# Patient Record
Sex: Male | Born: 1960 | Race: White | Hispanic: No | Marital: Married | Smoking: Current every day smoker
Health system: Southern US, Community
[De-identification: ages and names within clinical notes are randomized; demographics above are authoritative.]

## PROBLEM LIST (undated history)

## (undated) DIAGNOSIS — Z8489 Family history of other specified conditions: Secondary | ICD-10-CM

## (undated) DIAGNOSIS — R112 Nausea with vomiting, unspecified: Secondary | ICD-10-CM

## (undated) DIAGNOSIS — Z9889 Other specified postprocedural states: Secondary | ICD-10-CM

## (undated) DIAGNOSIS — K219 Gastro-esophageal reflux disease without esophagitis: Secondary | ICD-10-CM

## (undated) HISTORY — PX: KNEE SURGERY: SHX244

## (undated) HISTORY — PX: EYE SURGERY: SHX253

---

## 2018-08-18 ENCOUNTER — Emergency Department (HOSPITAL_COMMUNITY): Payer: Self-pay

## 2018-08-18 ENCOUNTER — Emergency Department (HOSPITAL_COMMUNITY): Payer: Self-pay | Admitting: Anesthesiology

## 2018-08-18 ENCOUNTER — Observation Stay (HOSPITAL_COMMUNITY)
Admission: EM | Admit: 2018-08-18 | Discharge: 2018-08-18 | Disposition: A | Discharge status: Home / Self Care | Payer: Self-pay | Attending: General Surgery | Admitting: General Surgery

## 2018-08-18 ENCOUNTER — Other Ambulatory Visit: Payer: Self-pay

## 2018-08-18 ENCOUNTER — Encounter (HOSPITAL_COMMUNITY)
Admission: EM | Disposition: A | Discharge status: Home / Self Care | Payer: Self-pay | Source: Home / Self Care | Attending: Emergency Medicine

## 2018-08-18 ENCOUNTER — Encounter (HOSPITAL_COMMUNITY): Payer: Self-pay

## 2018-08-18 DIAGNOSIS — R1011 Right upper quadrant pain: Secondary | ICD-10-CM

## 2018-08-18 DIAGNOSIS — I700 Atherosclerosis of aorta: Secondary | ICD-10-CM | POA: Insufficient documentation

## 2018-08-18 DIAGNOSIS — K8012 Calculus of gallbladder with acute and chronic cholecystitis without obstruction: Principal | ICD-10-CM | POA: Insufficient documentation

## 2018-08-18 DIAGNOSIS — K8000 Calculus of gallbladder with acute cholecystitis without obstruction: Secondary | ICD-10-CM

## 2018-08-18 DIAGNOSIS — I2510 Atherosclerotic heart disease of native coronary artery without angina pectoris: Secondary | ICD-10-CM | POA: Insufficient documentation

## 2018-08-18 DIAGNOSIS — F17200 Nicotine dependence, unspecified, uncomplicated: Secondary | ICD-10-CM | POA: Insufficient documentation

## 2018-08-18 DIAGNOSIS — R0781 Pleurodynia: Secondary | ICD-10-CM

## 2018-08-18 DIAGNOSIS — K219 Gastro-esophageal reflux disease without esophagitis: Secondary | ICD-10-CM | POA: Insufficient documentation

## 2018-08-18 DIAGNOSIS — J9811 Atelectasis: Secondary | ICD-10-CM | POA: Insufficient documentation

## 2018-08-18 DIAGNOSIS — K810 Acute cholecystitis: Secondary | ICD-10-CM

## 2018-08-18 HISTORY — DX: Gastro-esophageal reflux disease without esophagitis: K21.9

## 2018-08-18 HISTORY — DX: Family history of other specified conditions: Z84.89

## 2018-08-18 HISTORY — DX: Other specified postprocedural states: R11.2

## 2018-08-18 HISTORY — DX: Other specified postprocedural states: Z98.890

## 2018-08-18 HISTORY — PX: CHOLECYSTECTOMY: SHX55

## 2018-08-18 LAB — BASIC METABOLIC PANEL
Anion gap: 10 (ref 5–15)
BUN: 9 mg/dL (ref 6–20)
CO2: 21 mmol/L — ABNORMAL LOW (ref 22–32)
Calcium: 8.8 mg/dL — ABNORMAL LOW (ref 8.9–10.3)
Chloride: 104 mmol/L (ref 98–111)
Creatinine, Ser: 0.88 mg/dL (ref 0.61–1.24)
GFR calc Af Amer: >60 mL/min (ref >60)
GFR calc non Af Amer: >60 mL/min (ref >60)
Glucose, Bld: 161 mg/dL — ABNORMAL HIGH (ref 70–99)
Potassium: 3.6 mmol/L (ref 3.5–5.1)
Sodium: 135 mmol/L (ref 135–145)

## 2018-08-18 LAB — I-STAT TROPONIN, ED: Troponin i, poc: 0 ng/mL (ref 0.00–0.08)

## 2018-08-18 LAB — CBC
HCT: 42.1 % (ref 39.0–52.0)
Hemoglobin: 14.1 g/dL (ref 13.0–17.0)
MCH: 29.9 pg (ref 26.0–34.0)
MCHC: 33.5 g/dL (ref 30.0–36.0)
MCV: 89.4 fL (ref 80.0–100.0)
PLATELETS: 228 10*3/uL (ref 150–400)
RBC: 4.71 MIL/uL (ref 4.22–5.81)
RDW: 13.1 % (ref 11.5–15.5)
WBC: 12.3 10*3/uL — ABNORMAL HIGH (ref 4.0–10.5)
nRBC: 0 % (ref 0.0–0.2)

## 2018-08-18 LAB — HEPATIC FUNCTION PANEL
ALBUMIN: 3.9 g/dL (ref 3.5–5.0)
ALT: 60 U/L — ABNORMAL HIGH (ref 0–44)
AST: 29 U/L (ref 15–41)
Alkaline Phosphatase: 64 U/L (ref 38–126)
Bilirubin, Direct: <0.1 mg/dL (ref 0.0–0.2)
Total Bilirubin: 0.5 mg/dL (ref 0.3–1.2)
Total Protein: 7.2 g/dL (ref 6.5–8.1)

## 2018-08-18 LAB — LIPASE, BLOOD: Lipase: 32 U/L (ref 11–51)

## 2018-08-18 SURGERY — LAPAROSCOPIC CHOLECYSTECTOMY
Anesthesia: General

## 2018-08-18 MED ORDER — MEPERIDINE HCL 50 MG/ML IJ SOLN: 6.2500 mg | INTRAMUSCULAR | Status: DC | PRN

## 2018-08-18 MED ORDER — DOCUSATE SODIUM 100 MG PO CAPS: 100.0000 mg | ORAL_CAPSULE | Freq: Two times a day (BID) | ORAL | Status: DC

## 2018-08-18 MED ORDER — SUGAMMADEX SODIUM 500 MG/5ML IV SOLN
INTRAVENOUS | Status: DC | PRN
  Administered 2018-08-18: 399.2 mg via INTRAVENOUS

## 2018-08-18 MED ORDER — DEXAMETHASONE SODIUM PHOSPHATE 4 MG/ML IJ SOLN
INTRAMUSCULAR | Status: DC | PRN
  Administered 2018-08-18: 8 mg via INTRAVENOUS

## 2018-08-18 MED ORDER — ONDANSETRON 4 MG PO TBDP: 4.0000 mg | ORAL_TABLET | Freq: Four times a day (QID) | ORAL | 0 refills | Status: DC | PRN

## 2018-08-18 MED ORDER — NICOTINE 21 MG/24HR TD PT24: 21.0000 mg | MEDICATED_PATCH | Freq: Every day | TRANSDERMAL | Status: DC

## 2018-08-18 MED ORDER — SIMETHICONE 80 MG PO CHEW: 40.0000 mg | CHEWABLE_TABLET | Freq: Four times a day (QID) | ORAL | Status: DC | PRN

## 2018-08-18 MED ORDER — FENTANYL CITRATE (PF) 250 MCG/5ML IJ SOLN
INTRAMUSCULAR | Status: AC
  Filled 2018-08-18: qty 5

## 2018-08-18 MED ORDER — IOPAMIDOL (ISOVUE-300) INJECTION 61%
100.0000 mL | Freq: Once | INTRAVENOUS | Status: AC | PRN
  Administered 2018-08-18: 100 mL via INTRAVENOUS

## 2018-08-18 MED ORDER — SUGAMMADEX SODIUM 500 MG/5ML IV SOLN
INTRAVENOUS | Status: AC
  Filled 2018-08-18: qty 5

## 2018-08-18 MED ORDER — PROPOFOL 10 MG/ML IV BOLUS
INTRAVENOUS | Status: DC | PRN
  Administered 2018-08-18: 200 mg via INTRAVENOUS

## 2018-08-18 MED ORDER — PROPOFOL 10 MG/ML IV BOLUS
INTRAVENOUS | Status: AC
  Filled 2018-08-18: qty 20

## 2018-08-18 MED ORDER — FENTANYL CITRATE (PF) 100 MCG/2ML IJ SOLN
INTRAMUSCULAR | Status: AC
  Filled 2018-08-18: qty 2

## 2018-08-18 MED ORDER — FENTANYL CITRATE (PF) 100 MCG/2ML IJ SOLN
100.0000 ug | Freq: Once | INTRAMUSCULAR | Status: AC
  Administered 2018-08-18: 100 ug via INTRAVENOUS
  Filled 2018-08-18: qty 2

## 2018-08-18 MED ORDER — ONDANSETRON HCL 4 MG/2ML IJ SOLN
INTRAMUSCULAR | Status: DC | PRN
  Administered 2018-08-18: 4 mg via INTRAVENOUS

## 2018-08-18 MED ORDER — OXYCODONE HCL 5 MG PO TABS: 5.0000 mg | ORAL_TABLET | ORAL | Status: DC | PRN

## 2018-08-18 MED ORDER — ONDANSETRON HCL 4 MG/2ML IJ SOLN: 4.0000 mg | Freq: Once | INTRAMUSCULAR | Status: DC | PRN

## 2018-08-18 MED ORDER — ZOLPIDEM TARTRATE 5 MG PO TABS: 5.0000 mg | ORAL_TABLET | Freq: Every evening | ORAL | Status: DC | PRN

## 2018-08-18 MED ORDER — ONDANSETRON 4 MG PO TBDP: 4.0000 mg | ORAL_TABLET | Freq: Four times a day (QID) | ORAL | Status: DC | PRN

## 2018-08-18 MED ORDER — LIDOCAINE HCL (CARDIAC) PF 100 MG/5ML IV SOSY
PREFILLED_SYRINGE | INTRAVENOUS | Status: DC | PRN
  Administered 2018-08-18: 100 mg via INTRAVENOUS

## 2018-08-18 MED ORDER — DIPHENHYDRAMINE HCL 50 MG/ML IJ SOLN: 12.5000 mg | Freq: Four times a day (QID) | INTRAMUSCULAR | Status: DC | PRN

## 2018-08-18 MED ORDER — KETOROLAC TROMETHAMINE 30 MG/ML IJ SOLN: 30.0000 mg | Freq: Four times a day (QID) | INTRAMUSCULAR | Status: DC

## 2018-08-18 MED ORDER — LIDOCAINE 2% (20 MG/ML) 5 ML SYRINGE
INTRAMUSCULAR | Status: AC
  Filled 2018-08-18: qty 5

## 2018-08-18 MED ORDER — ONDANSETRON HCL 4 MG/2ML IJ SOLN
4.0000 mg | Freq: Four times a day (QID) | INTRAMUSCULAR | Status: DC | PRN
  Administered 2018-08-18: 4 mg via INTRAVENOUS
  Filled 2018-08-18: qty 2

## 2018-08-18 MED ORDER — MIDAZOLAM HCL 5 MG/5ML IJ SOLN
INTRAMUSCULAR | Status: DC | PRN
  Administered 2018-08-18: 2 mg via INTRAVENOUS

## 2018-08-18 MED ORDER — OXYCODONE HCL 5 MG PO TABS: 5.0000 mg | ORAL_TABLET | ORAL | 0 refills | Status: AC | PRN

## 2018-08-18 MED ORDER — MIDAZOLAM HCL 2 MG/2ML IJ SOLN
INTRAMUSCULAR | Status: AC
  Filled 2018-08-18: qty 2

## 2018-08-18 MED ORDER — DOCUSATE SODIUM 100 MG PO CAPS: 100.0000 mg | ORAL_CAPSULE | Freq: Two times a day (BID) | ORAL | 1 refills | Status: DC | PRN

## 2018-08-18 MED ORDER — HYDROMORPHONE HCL 1 MG/ML IJ SOLN: 0.2500 mg | INTRAMUSCULAR | Status: DC | PRN

## 2018-08-18 MED ORDER — ROCURONIUM BROMIDE 10 MG/ML (PF) SYRINGE
PREFILLED_SYRINGE | INTRAVENOUS | Status: DC | PRN
  Administered 2018-08-18: 10 mg via INTRAVENOUS
  Administered 2018-08-18: 50 mg via INTRAVENOUS

## 2018-08-18 MED ORDER — SODIUM CHLORIDE 0.9 % IR SOLN
Status: DC | PRN
  Administered 2018-08-18: 1000 mL

## 2018-08-18 MED ORDER — PANTOPRAZOLE SODIUM 40 MG IV SOLR: 40.0000 mg | Freq: Every day | INTRAVENOUS | Status: DC

## 2018-08-18 MED ORDER — ONDANSETRON HCL 4 MG/2ML IJ SOLN
INTRAMUSCULAR | Status: AC
  Filled 2018-08-18: qty 2

## 2018-08-18 MED ORDER — ONDANSETRON HCL 4 MG/2ML IJ SOLN
4.0000 mg | Freq: Once | INTRAMUSCULAR | Status: AC
  Administered 2018-08-18: 4 mg via INTRAVENOUS
  Filled 2018-08-18: qty 2

## 2018-08-18 MED ORDER — KETOROLAC TROMETHAMINE 30 MG/ML IJ SOLN
30.0000 mg | Freq: Once | INTRAMUSCULAR | Status: DC | PRN
  Filled 2018-08-18: qty 1

## 2018-08-18 MED ORDER — BUPIVACAINE HCL (PF) 0.5 % IJ SOLN
INTRAMUSCULAR | Status: AC
  Filled 2018-08-18: qty 30

## 2018-08-18 MED ORDER — FENTANYL CITRATE (PF) 100 MCG/2ML IJ SOLN
INTRAMUSCULAR | Status: DC | PRN
  Administered 2018-08-18 (×2): 100 ug via INTRAVENOUS
  Administered 2018-08-18 (×3): 50 ug via INTRAVENOUS
  Administered 2018-08-18: 100 ug via INTRAVENOUS

## 2018-08-18 MED ORDER — MORPHINE SULFATE (PF) 2 MG/ML IV SOLN
2.0000 mg | INTRAVENOUS | Status: DC | PRN
  Administered 2018-08-18: 2 mg via INTRAVENOUS
  Filled 2018-08-18: qty 1

## 2018-08-18 MED ORDER — KETOROLAC TROMETHAMINE 30 MG/ML IJ SOLN: 30.0000 mg | Freq: Four times a day (QID) | INTRAMUSCULAR | Status: DC | PRN

## 2018-08-18 MED ORDER — HYDROCODONE-ACETAMINOPHEN 7.5-325 MG PO TABS: 1.0000 | ORAL_TABLET | Freq: Once | ORAL | Status: DC | PRN

## 2018-08-18 MED ORDER — SODIUM CHLORIDE 0.9 % IV SOLN
2.0000 g | INTRAVENOUS | Status: AC
  Administered 2018-08-18: 2 g via INTRAVENOUS

## 2018-08-18 MED ORDER — BUPIVACAINE HCL (PF) 0.5 % IJ SOLN
INTRAMUSCULAR | Status: DC | PRN
  Administered 2018-08-18: 10 mL

## 2018-08-18 MED ORDER — CHLORHEXIDINE GLUCONATE CLOTH 2 % EX PADS: 6.0000 | MEDICATED_PAD | Freq: Once | CUTANEOUS | Status: DC

## 2018-08-18 MED ORDER — HEMOSTATIC AGENTS (NO CHARGE) OPTIME
TOPICAL | Status: DC | PRN
  Administered 2018-08-18: 1 via TOPICAL

## 2018-08-18 MED ORDER — DEXAMETHASONE SODIUM PHOSPHATE 10 MG/ML IJ SOLN
INTRAMUSCULAR | Status: AC
  Filled 2018-08-18: qty 1

## 2018-08-18 MED ORDER — LACTATED RINGERS IV SOLN
INTRAVENOUS | Status: DC
  Administered 2018-08-18: 13:00:00 via INTRAVENOUS

## 2018-08-18 MED ORDER — DIPHENHYDRAMINE HCL 12.5 MG/5ML PO ELIX: 12.5000 mg | ORAL_SOLUTION | Freq: Four times a day (QID) | ORAL | Status: DC | PRN

## 2018-08-18 SURGICAL SUPPLY — 45 items
APPLIER CLIP ROT 10 11.4 M/L (STAPLE) ×3
BAG RETRIEVAL 10 (BASKET) ×1
BAG RETRIEVAL 10MM (BASKET) ×1
BLADE SURG 15 STRL LF DISP TIS (BLADE) ×1 IMPLANT
BLADE SURG 15 STRL SS (BLADE) ×2
CHLORAPREP W/TINT 26ML (MISCELLANEOUS) ×3 IMPLANT
CLIP APPLIE ROT 10 11.4 M/L (STAPLE) ×1 IMPLANT
CLOTH BEACON ORANGE TIMEOUT ST (SAFETY) ×3 IMPLANT
COVER LIGHT HANDLE STERIS (MISCELLANEOUS) ×6 IMPLANT
DECANTER SPIKE VIAL GLASS SM (MISCELLANEOUS) ×3 IMPLANT
DERMABOND ADVANCED (GAUZE/BANDAGES/DRESSINGS) ×2
DERMABOND ADVANCED .7 DNX12 (GAUZE/BANDAGES/DRESSINGS) ×1 IMPLANT
ELECT REM PT RETURN 9FT ADLT (ELECTROSURGICAL) ×3
ELECTRODE REM PT RTRN 9FT ADLT (ELECTROSURGICAL) ×1 IMPLANT
FILTER SMOKE EVAC LAPAROSHD (FILTER) ×3 IMPLANT
GLOVE BIO SURGEON STRL SZ 6.5 (GLOVE) ×2 IMPLANT
GLOVE BIO SURGEONS STRL SZ 6.5 (GLOVE) ×1
GLOVE BIOGEL M 7.0 STRL (GLOVE) ×3 IMPLANT
GLOVE BIOGEL PI IND STRL 6.5 (GLOVE) ×1 IMPLANT
GLOVE BIOGEL PI IND STRL 7.0 (GLOVE) ×3 IMPLANT
GLOVE BIOGEL PI INDICATOR 6.5 (GLOVE) ×2
GLOVE BIOGEL PI INDICATOR 7.0 (GLOVE) ×6
GLOVE ECLIPSE 6.5 STRL STRAW (GLOVE) ×3 IMPLANT
GOWN STRL REUS W/TWL LRG LVL3 (GOWN DISPOSABLE) ×9 IMPLANT
HEMOSTAT SNOW SURGICEL 2X4 (HEMOSTASIS) ×3 IMPLANT
INST SET LAPROSCOPIC AP (KITS) ×3 IMPLANT
KIT TURNOVER KIT A (KITS) ×3 IMPLANT
MANIFOLD NEPTUNE II (INSTRUMENTS) ×3 IMPLANT
NEEDLE INSUFFLATION 14GA 120MM (NEEDLE) ×3 IMPLANT
NS IRRIG 1000ML POUR BTL (IV SOLUTION) ×3 IMPLANT
PACK LAP CHOLE LZT030E (CUSTOM PROCEDURE TRAY) ×3 IMPLANT
PAD ARMBOARD 7.5X6 YLW CONV (MISCELLANEOUS) ×3 IMPLANT
SET BASIN LINEN APH (SET/KITS/TRAYS/PACK) ×3 IMPLANT
SLEEVE ENDOPATH XCEL 5M (ENDOMECHANICALS) ×3 IMPLANT
SUT MNCRL AB 4-0 PS2 18 (SUTURE) ×3 IMPLANT
SUT VICRYL 0 UR6 27IN ABS (SUTURE) ×3 IMPLANT
SYS BAG RETRIEVAL 10MM (BASKET) ×1
SYSTEM BAG RETRIEVAL 10MM (BASKET) ×1 IMPLANT
TROCAR ENDO BLADELESS 11MM (ENDOMECHANICALS) ×3 IMPLANT
TROCAR XCEL NON-BLD 5MMX100MML (ENDOMECHANICALS) ×3 IMPLANT
TROCAR XCEL UNIV SLVE 11M 100M (ENDOMECHANICALS) ×3 IMPLANT
TUBE CONNECTING 12'X1/4 (SUCTIONS) ×1
TUBE CONNECTING 12X1/4 (SUCTIONS) ×2 IMPLANT
TUBING INSUFFLATION (TUBING) ×3 IMPLANT
WARMER LAPAROSCOPE (MISCELLANEOUS) ×3 IMPLANT

## 2018-08-18 NOTE — Discharge Summary (Signed)
Physician Discharge Summary  Patient ID: Chad Underwood MRN: [id number] DOB/AGE: [birth year] 56 y.o.  Admit date: 08/18/2018 Discharge date: 08/19/2018  Admission Diagnoses: Acute cholecystitis   Discharge Diagnoses:  Active Problems:   Calculus of gallbladder with acute cholecystitis without obstruction   Acute cholecystitis   Discharged Condition: good  Hospital Course:  Chad Underwood was seen in the ED and found to have acute cholecystitis. He was taken to the OR for surgery and did well post operatively. He was eating, drinking, ambulating, and had good pain control and wanted to go home.   Treatments: Laparoscopic cholecystectomy  Discharge Exam: Blood pressure 120/69, pulse 96, temperature 99.3 F (37.4 C), temperature source Oral, resp. rate 20, height 5\' 10"  (1.778 m), weight 213 lb 10 oz (96.9 kg), SpO2 95 %.  Disposition:   Discharge Instructions    Call MD for:  difficulty breathing, headache or visual disturbances   Complete by:  As directed    Call MD for:  extreme fatigue   Complete by:  As directed    Call MD for:  persistant dizziness or light-headedness   Complete by:  As directed    Call MD for:  persistant nausea and vomiting   Complete by:  As directed    Call MD for:  redness, tenderness, or signs of infection (pain, swelling, redness, odor or green/yellow discharge around incision site)   Complete by:  As directed    Call MD for:  severe uncontrolled pain   Complete by:  As directed    Call MD for:  temperature >100.4   Complete by:  As directed    Diet - low sodium heart healthy   Complete by:  As directed    Increase activity slowly   Complete by:  As directed      Allergies as of 08/18/2018   No Known Allergies     Medication List    TAKE these medications   docusate sodium 100 MG capsule Commonly known as:  COLACE Take 1 capsule (100 mg total) by mouth 2 (two) times daily as needed for mild constipation.   ondansetron 4 MG disintegrating  tablet Commonly known as:  ZOFRAN-ODT Take 1 tablet (4 mg total) by mouth every 6 (six) hours as needed for nausea.   oxyCODONE 5 MG immediate release tablet Commonly known as:  Oxy IR/ROXICODONE Take 1 tablet (5 mg total) by mouth every 4 (four) hours as needed for severe pain or breakthrough pain.      Follow-up Information    Lucretia Roers, MD.   Specialty:  General Surgery Contact information: [address] [place] [address] [phone]           Signed: Lucretia Roers 08/19/2018, 1:45 PM

## 2018-08-18 NOTE — Anesthesia Procedure Notes (Signed)
Procedure Name: Intubation Date/Time: 08/18/2018 12:55 PM Performed by: Shanon Payor, CRNA Pre-anesthesia Checklist: Patient identified, Emergency Drugs available, Suction available, Patient being monitored and Timeout performed Patient Re-evaluated:Patient Re-evaluated prior to induction Oxygen Delivery Method: Circle system utilized Preoxygenation: Pre-oxygenation with 100% oxygen Induction Type: IV induction Ventilation: Two handed mask ventilation required Laryngoscope Size: Glidescope and 3 Grade View: Grade II Tube type: Oral Tube size: 7.0 mm Number of attempts: 1 Airway Equipment and Method: Stylet and Video-laryngoscopy Placement Confirmation: ETT inserted through vocal cords under direct vision,  positive ETCO2 and breath sounds checked- equal and bilateral Secured at: 21 cm Tube secured with: Tape Dental Injury: Teeth and Oropharynx as per pre-operative assessment

## 2018-08-18 NOTE — Discharge Instructions (Signed)
Discharge Laparoscopic Surgery Instructions:  Common Complaints: Right shoulder pain is common after laparoscopic surgery. This is secondary to the gas used in the surgery being trapped under the diaphragm.  Walk to help your body absorb the gas. This will improve in a few days. Pain at the port sites are common, especially the larger port sites. This will improve with time.  Some nausea is common and poor appetite. The main goal is to stay hydrated the first few days after surgery.   Diet/ Activity: Diet as tolerated. You may not have an appetite, but it is important to stay hydrated. Drink 64 ounces of water a day. Your appetite will return with time.  Shower per your regular routine daily.  Do not take hot showers. Take warm showers that are less than 10 minutes. Rest and listen to your body, but do not remain in bed all day.  Walk everyday for at least 15-20 minutes. Deep cough and move around every 1-2 hours in the first few days after surgery.  Use your incentive spirometer every commercial break or at least three times per hour while awake. Do not lift > 10 lbs, perform excessive bending, pushing, pulling, squatting for 1-2 weeks after surgery.  Do not pick at the dermabond glue on your incision sites.  This glue film will remain in place for 1-2 weeks and will start to peel off.  Do not place lotions or balms on your incision unless instructed to specifically by Dr. Henreitta Leber.   Medication: Take tylenol and ibuprofen as needed for pain control, alternating every 4-6 hours.  Example:  Tylenol 1000mg  @ 6am, 12noon, 6pm, (Do not exceed 4000mg  of tylenol a day). Ibuprofen 800mg  @ 9am, 3pm, 9pm, 3am (Do not exceed 3600mg  of ibuprofen a day).  Take Roxicodone for breakthrough pain every 4 hours.  Take Colace for constipation related to narcotic pain medication. If you do not have a bowel movement in 2 days, take Miralax over the counter.  Drink plenty of water to also prevent  constipation.   Contact Information: If you have questions or concerns, please call our office, [phone], Monday- Thursday 8AM-5PM and Friday 8AM-12Noon.  If it is after hours or on the weekend, please call [facility], [phone], and ask to speak to the surgeon on call for Dr. Henreitta Leber at [facility].    Laparoscopic Cholecystectomy, Care After This sheet gives you information about how to care for yourself after your procedure. Your doctor may also give you more specific instructions. If you have problems or questions, contact your doctor. Follow these instructions at home: Care for cuts from surgery (incisions)   Follow instructions from your doctor about how to take care of your cuts from surgery. Make sure you: ? Wash your hands with soap and water before you change your bandage (dressing). If you cannot use soap and water, use hand sanitizer. ? Change your bandage as told by your doctor. ? Leave stitches (sutures), skin glue, or skin tape (adhesive) strips in place. They may need to stay in place for 2 weeks or longer. If tape strips get loose and curl up, you may trim the loose edges. Do not remove tape strips completely unless your doctor says it is okay.  Do not take baths, swim, or use a hot tub until your doctor says it is okay. You may shower.  Check your surgical cut area every day for signs of infection. Check for: ? More redness, swelling, or pain. ? More  fluid or blood. ? Warmth. ? Pus or a bad smell. Activity  Do not drive or use heavy machinery while taking prescription pain medicine.  Do not lift anything that is heavier than 10 lb (4.5 kg) until your doctor says it is okay.  Do not play contact sports until your doctor says it is okay.  Do not drive for 24 hours if you were given a medicine to help you relax (sedative).  Rest as needed. Do not return to work or school until your doctor says it is okay. General instructions  Take over-the-counter  and prescription medicines only as told by your doctor.  To prevent or treat constipation while you are taking prescription pain medicine, your doctor may recommend that you: ? Drink enough fluid to keep your pee (urine) clear or pale yellow. ? Take over-the-counter or prescription medicines. ? Eat foods that are high in fiber, such as fresh fruits and vegetables, whole grains, and beans. ? Limit foods that are high in fat and processed sugars, such as fried and sweet foods. Contact a doctor if:  You develop a rash.  You have more redness, swelling, or pain around your surgical cuts.  You have more fluid or blood coming from your surgical cuts.  Your surgical cuts feel warm to the touch.  You have pus or a bad smell coming from your surgical cuts.  You have a fever.  One or more of your surgical cuts breaks open. Get help right away if:  You have trouble breathing.  You have chest pain.  You have pain that is getting worse in your shoulders.  You faint or feel dizzy when you stand.  You have very bad pain in your belly (abdomen).  You are sick to your stomach (nauseous) for more than one day.  You have throwing up (vomiting) that lasts for more than one day.  You have leg pain. This information is not intended to replace advice given to you by your health care provider. Make sure you discuss any questions you have with your health care provider. Document Released: 05/11/2008 Document Revised: 02/21/2016 Document Reviewed: 01/19/2016 Elsevier Interactive Patient Education  2019 [employer]

## 2018-08-18 NOTE — Progress Notes (Signed)
[facility]  Patient ate and has good pain control. Asked RN to get IS to take home with him. Rx sent to [facility] in [place].  Algis Greenhouse, MD [facility] [address] [place], [place] [address] [phone] (office)

## 2018-08-18 NOTE — Anesthesia Postprocedure Evaluation (Signed)
Anesthesia Post Note  Patient: Chad Underwood  Procedure(s) Performed: LAPAROSCOPIC CHOLECYSTECTOMY (N/A )  Patient location during evaluation: PACU Anesthesia Type: General Level of consciousness: awake and alert and oriented Pain management: pain level controlled Vital Signs Assessment: post-procedure vital signs reviewed and stable Respiratory status: spontaneous breathing Cardiovascular status: blood pressure returned to baseline and stable Postop Assessment: no apparent nausea or vomiting Anesthetic complications: no     Last Vitals:  Vitals:   08/18/18 1418 08/18/18 1430  BP: 125/65 121/84  Pulse: (!) 102 (!) 105  Resp: 16 13  Temp: 36.5 C   SpO2: 95% 99%    Last Pain:  Vitals:   08/18/18 1430  TempSrc:   PainSc: 0-No pain                 Teryl Gubler

## 2018-08-18 NOTE — Op Note (Signed)
Operative Note   Preoperative Diagnosis: Acute Cholecystitis    Postoperative Diagnosis: Same   Procedure(s) Performed: Laparoscopic cholecystectomy   Surgeon: Lillia Abed C. Henreitta Leber, MD   Assistants: No qualified resident was available   Anesthesia: General endotracheal   Anesthesiologist: Shona Needles, MD    Specimens: Gallbladder    Estimated Blood Loss: Minimal    Blood Replacement: None    Complications: None    Operative Findings:  Inflamed, distended gallbladder    Procedure: The patient was taken to the operating room and placed supine. General endotracheal anesthesia was induced. Intravenous antibiotics were administered per protocol. An orogastric tube positioned to decompress the stomach. The abdomen was prepared and draped in the usual sterile fashion.    A supraumbilical incision was made and a Veress technique was utilized to achieve pneumoperitoneum to 15 mmHg with carbon dioxide. A 11 mm optiview port was placed through the supraumbilical region, and a 10 mm 0-degree operative laparoscope was introduced. The area underlying the trocar and Veress needle were inspected and without evidence of injury.  Remaining trocars were placed under direct vision. Two 5 mm ports were placed in the right abdomen, between the anterior axillary and midclavicular line.  A final 11 mm port was placed through the mid-epigastrium, near the falciform ligament.    The gallbladder was very distended and edematous.  It was decompressed and the bile was suctioned out.  The gallbladder was long and intrahepatic in nature.  We attempted to elevate the fundus but due to inflammation there was minimal amount of retraction I could get and minimal exposure of the infundibulum.  Given this, I started high on the gallbladder, stripping down the peritoneum fat from the gallbladder.  In addition, I mobilized the gallbladder medially and laterally from the hepatic bed doing a semi doom down approach to get  behind the gallbladder in order to verify the edge of the gallbladder was obtained.  Once the gallbladder was off the hepatic bed and the fatty mesentery was stripped away, the long thin cystic duct was noted and the cystic artery noted in the triangle of Calot.  These structures were completely mobilized.   The cystic duct was triply clipped and divided and cystic artery was doubly clipped and divided.  The mesentery going to the gallbladder at the hepatic bed was oozing slightly, and this was clipped with control of bleeding.  The remaining doom portion of the gallbladder was then dissected from the liver bed with electrocautery. The specimen was placed in an Endopouch and was retrieved through the epigastric site.   Final inspection revealed acceptable hemostasis. Surgical Jamelle Haring  was placed in the gallbladder bed. Trocars were removed and pneumoperitoneum was released.  The port sites were too small to be closed.  Skin incisions were closed with 4-0 Monocryl subcuticular sutures and Dermabond. The patient was awakened from anesthesia and extubated without complication.    Algis Greenhouse, MD [facility] [address] [place], [place] [address] [phone] (office)

## 2018-08-18 NOTE — ED Triage Notes (Addendum)
Pt reports a couple of days of n/vd, then felt better, but last night started having mid sternal and epigastric pain that wouldn't go away.  Pt also has a knot near the rib on the lower right chest, states it has recently become tender to touch.  Pt denies injury or trauma to same

## 2018-08-18 NOTE — Anesthesia Preprocedure Evaluation (Addendum)
Anesthesia Evaluation  Patient identified by MRN, date of birth, ID band Patient awake    Reviewed: Allergy & Precautions, H&P , NPO status , Patient's Chart, lab work & pertinent test results  History of Anesthesia Complications (+) PONV and history of anesthetic complications  Airway Mallampati: II  TM Distance: >3 FB Neck ROM: full    Dental  (+) Dental Advisory Given, Poor Dentition, Chipped, Missing   Pulmonary neg pulmonary ROS, Current Smoker,    Pulmonary exam normal breath sounds clear to auscultation       Cardiovascular Exercise Tolerance: Good negative cardio ROS   Rhythm:regular Rate:Normal     Neuro/Psych negative neurological ROS  negative psych ROS   GI/Hepatic negative GI ROS, Neg liver ROS, GERD  ,  Endo/Other  negative endocrine ROS  Renal/GU negative Renal ROS  negative genitourinary   Musculoskeletal   Abdominal   Peds  Hematology negative hematology ROS (+)   Anesthesia Other Findings   Reproductive/Obstetrics negative OB ROS                            Anesthesia Physical Anesthesia Plan  ASA: II  Anesthesia Plan: General   Post-op Pain Management:    Induction:   PONV Risk Score and Plan:   Airway Management Planned:   Additional Equipment:   Intra-op Plan:   Post-operative Plan:   Informed Consent: I have reviewed the patients History and Physical, chart, labs and discussed the procedure including the risks, benefits and alternatives for the proposed anesthesia with the patient or authorized representative who has indicated his/her understanding and acceptance.   Dental Advisory Given  Plan Discussed with: CRNA  Anesthesia Plan Comments:         Anesthesia Quick Evaluation

## 2018-08-18 NOTE — ED Provider Notes (Signed)
[facility] EMERGENCY DEPARTMENT Provider Note   CSN: [id number] Arrival date & time: 08/18/18  0349     History   Chief Complaint Chief Complaint  Patient presents with  . Chest Pain    HPI Chad Underwood is a 58 y.o. male.  The history is provided by the patient and the spouse.  Abdominal Pain   This is a new problem. The current episode started 6 to 12 hours ago. The problem has been gradually worsening. The pain is located in the RUQ and epigastric region. The pain is severe. Associated symptoms include nausea and vomiting. Pertinent negatives include fever. The symptoms are aggravated by palpation. Nothing relieves the symptoms.  Patient presents for upper abdominal pain. Reports over the past several days he has had episodes of nausea vomiting and diarrhea.  It began to improve, but then during the night he began having upper abdominal pain.  It is mostly in the lower chest and upper abdomen  PMH-none Home Medications    Prior to Admission medications   Not on File    Family History No family history on file.  Social History Social History   Tobacco Use  . Smoking status: Current Every Day Smoker  . Smokeless tobacco: Never Used  Substance Use Topics  . Alcohol use: Yes  . Drug use: Never     Allergies   Patient has no known allergies.   Review of Systems Review of Systems  Constitutional: Negative for fever.  Respiratory: Negative for shortness of breath.   Gastrointestinal: Positive for abdominal pain, nausea and vomiting.  All other systems reviewed and are negative.    Physical Exam Updated Vital Signs BP (!) 142/87   Pulse 73   Temp 97.9 F (36.6 C) (Oral)   Resp 19   Ht 1.778 m (5\' 10" )   Wt 99.8 kg   SpO2 97%   BMI 31.57 kg/m   Physical Exam CONSTITUTIONAL: Well developed/well nourished uncomfortable appearing HEAD: Normocephalic/atraumatic no icterus EYES: EOMI/PERRL ENMT: Mucous membranes moist NECK: supple no meningeal  signs SPINE/BACK:entire spine nontender CV: S1/S2 noted, no murmurs/rubs/gallops noted LUNGS: Lungs are clear to auscultation bilaterally, no apparent distress ABDOMEN: soft, moderate right upper quadrant tenderness,+ Murphy sign, no rebound or guarding, bowel sounds noted throughout abdomen GU:no cva tenderness NEURO: Pt is awake/alert/appropriate, moves all extremitiesx4.  No facial droop.   EXTREMITIES: pulses normal/equal, full ROM SKIN: warm, color normal PSYCH: no abnormalities of mood noted, alert and oriented to situation   ED Treatments / Results  Labs (all labs ordered are listed, but only abnormal results are displayed) Labs Reviewed  BASIC METABOLIC PANEL - Abnormal; Notable for the following components:      Result Value   CO2 21 (*)    Glucose, Bld 161 (*)    Calcium 8.8 (*)    All other components within normal limits  CBC - Abnormal; Notable for the following components:   WBC 12.3 (*)    All other components within normal limits  HEPATIC FUNCTION PANEL - Abnormal; Notable for the following components:   ALT 60 (*)    All other components within normal limits  LIPASE, BLOOD  I-STAT TROPONIN, ED    EKG EKG Interpretation  Date/Time:  Friday August 18 2018 04:10:25 EST Ventricular Rate:  72 PR Interval:    QRS Duration: 92 QT Interval:  365 QTC Calculation: 400 R Axis:   -23 Text Interpretation:  Sinus rhythm Borderline left axis deviation Low voltage, precordial leads  No previous ECGs available Confirmed by Zadie RhineWickline, Cynia Abruzzo ([id number]) on 08/18/2018 5:22:39 AM   Radiology Dg Chest 2 View  Result Date: 08/18/2018 CLINICAL DATA:  Acute onset of nausea, vomiting and diarrhea. Midsternal and epigastric abdominal pain. Right-sided rib knot. EXAM: CHEST - 2 VIEW COMPARISON:  None. FINDINGS: The lungs are well-aerated. Mild left basilar airspace opacity may reflect atelectasis or mild pneumonia. There is no evidence of pleural effusion or pneumothorax. The heart is  normal in size; the mediastinal contour is within normal limits. No acute osseous abnormalities are seen. IMPRESSION: Mild left basilar airspace opacity may reflect atelectasis or mild pneumonia. Electronically Signed   By: Roanna RaiderJeffery  Chang M.D.   On: 08/18/2018 04:49   Dg Ribs Unilateral Right  Result Date: 08/18/2018 CLINICAL DATA:  Acute onset of right-sided rib knot, with tenderness. Midsternal chest pain. EXAM: RIGHT RIBS - 2 VIEW COMPARISON:  None. FINDINGS: No displaced rib fractures are seen. The visualized portions of the lungs are grossly clear. The right glenohumeral joint is unremarkable in appearance. IMPRESSION: No displaced rib fracture seen. Electronically Signed   By: Roanna RaiderJeffery  Chang M.D.   On: 08/18/2018 04:50    Procedures Procedures   Medications Ordered in ED Medications  ondansetron (ZOFRAN) injection 4 mg (4 mg Intravenous Given 08/18/18 0543)  fentaNYL (SUBLIMAZE) injection 100 mcg (100 mcg Intravenous Given 08/18/18 0543)     Initial Impression / Assessment and Plan / ED Course  I have reviewed the triage vital signs and the nursing notes.  Pertinent labs & imaging results that were available during my care of the patient were reviewed by me and considered in my medical decision making (see chart for details).     6:52 AM Patient presented with upper abdominal pain.  He was initially reported out as chest pain, but on my evaluation his pain is mostly in his abdomen.  Biliary colic is highly suspected.  Patient and spouse are also concerned about a possible knot in his lower chest and upper abdomen.  There is no abscess, it actually appears to be more of a bony prominence from his ribs.  No obvious foreign body noted on exam.  6:55 AM Signed out to dr pickering with RUQ ultrasound pending  Final Clinical Impressions(s) / ED Diagnoses   Final diagnoses:  RUQ pain    ED Discharge Orders    None       Zadie RhineWickline, Jim Lundin, MD 08/18/18 [phone]

## 2018-08-18 NOTE — ED Notes (Signed)
US completed at bedside.

## 2018-08-18 NOTE — H&P (Addendum)
[facility] History and Physical  Reason for Referral: Acute Cholecystis  Referring Physician:  Dr. Lottie MusselPickering    Chad Underwood is a 58 y.o. male.  HPI: Chad Underwood is a 58 yo who presented with worsening right upper quadrant/ epigastric pain over the past two days. He says that last Sunday he ate some [place] food and have diarrhea/  Vomiting with some abdominal pain but that he thought this was from food poisoning. He has not been feeling great since that time, and has continued to have some pain but the nausea/vomiting subsided.  He says that he had thought he was getting better, but again yesterday he was having such terrible RUQ/ epigastric pain that he could not tolerate it and came to the ED. The pain is sharp and constant in nature. He also reported feeling bloated and possibly having some indigestion but this was not relieved with any Pepto or sprite that he tried.   He continues to have pain in the region that is only relived by the ED pain medications.  He denies use of NSAID, BC powder, Aleve/ Motrin.    History reviewed. No pertinent past medical history.  Denies any HTN, Diabetes   History reviewed. No pertinent surgical history.  No abdominal surgeries. Reports nausea post operatively   History reviewed. No pertinent family history.  Social History   Tobacco Use  . Smoking status: Current Every Day Smoker  . Smokeless tobacco: Never Used  Substance Use Topics  . Alcohol use: Yes  . Drug use: Never    Medications: I have reviewed the patient's current medications. Takes Pepto occassionally.   Allergies as of 08/18/2018   No Known Allergies     Medication List    You have not been prescribed any medications.     ROS:  A comprehensive review of systems was negative except for: Gastrointestinal: positive for abdominal pain, diarrhea, nausea and vomiting  Denies any chest pain or pressure, no SOB   Blood pressure 127/72, pulse 81, temperature  97.9 F (36.6 C), temperature source Oral, resp. rate 17, height 5\' 10"  (1.778 m), weight 99.8 kg, SpO2 96 %. Physical Exam Vitals signs reviewed.  Constitutional:      Appearance: He is well-developed.  HENT:     Head: Normocephalic and atraumatic.  Eyes:     Pupils: Pupils are equal, round, and reactive to light.  Neck:     Musculoskeletal: Normal range of motion.  Cardiovascular:     Rate and Rhythm: Normal rate and regular rhythm.     Heart sounds: Normal heart sounds.  Pulmonary:     Effort: Pulmonary effort is normal.     Breath sounds: Normal breath sounds.  Abdominal:     Palpations: Abdomen is soft.     Tenderness: There is abdominal tenderness in the right upper quadrant and epigastric area. There is no guarding or rebound.     Comments: Right anterior abdomen at rib edge, lipoma palpated about 1.5cm   Musculoskeletal: Normal range of motion.     Right lower leg: No edema.  Skin:    General: Skin is warm and dry.  Neurological:     General: No focal deficit present.     Mental Status: He is alert and oriented to person, place, and time.  Psychiatric:        Mood and Affect: Mood normal.        Behavior: Behavior normal.     Results: Results for orders placed  or performed during the hospital encounter of 08/18/18 (from the past 48 hour(s))  Basic metabolic panel     Status: Abnormal   Collection Time: 08/18/18  4:21 AM  Result Value Ref Range   Sodium 135 135 - 145 mmol/L   Potassium 3.6 3.5 - 5.1 mmol/L   Chloride 104 98 - 111 mmol/L   CO2 21 (L) 22 - 32 mmol/L   Glucose, Bld 161 (H) 70 - 99 mg/dL   BUN 9 6 - 20 mg/dL   Creatinine, Ser 1.61 0.61 - 1.24 mg/dL   Calcium 8.8 (L) 8.9 - 10.3 mg/dL   GFR calc non Af Amer >60 >60 mL/min   GFR calc Af Amer >60 >60 mL/min   Anion gap 10 5 - 15    Comment: Performed at [facility], [address], [place], [place] [address]  CBC     Status: Abnormal   Collection Time: 08/18/18  4:21 AM  Result Value Ref Range    WBC 12.3 (H) 4.0 - 10.5 K/uL   RBC 4.71 4.22 - 5.81 MIL/uL   Hemoglobin 14.1 13.0 - 17.0 g/dL   HCT 54.0 98.1 - 19.1 %   MCV 89.4 80.0 - 100.0 fL   MCH 29.9 26.0 - 34.0 pg   MCHC 33.5 30.0 - 36.0 g/dL   RDW 47.8 29.5 - 62.1 %   Platelets 228 150 - 400 K/uL   nRBC 0.0 0.0 - 0.2 %    Comment: Performed at [facility], [address], [place], [place] [address]  Hepatic function panel     Status: Abnormal   Collection Time: 08/18/18  4:21 AM  Result Value Ref Range   Total Protein 7.2 6.5 - 8.1 g/dL   Albumin 3.9 3.5 - 5.0 g/dL   AST 29 15 - 41 U/L   ALT 60 (H) 0 - 44 U/L   Alkaline Phosphatase 64 38 - 126 U/L   Total Bilirubin 0.5 0.3 - 1.2 mg/dL   Bilirubin, Direct <7.8 0.0 - 0.2 mg/dL   Indirect Bilirubin NOT CALCULATED 0.3 - 0.9 mg/dL    Comment: Performed at [facility], [address], [place], [place] [address]  Lipase, blood     Status: None   Collection Time: 08/18/18  4:21 AM  Result Value Ref Range   Lipase 32 11 - 51 U/L    Comment: Performed at [facility], [address] [place], [place] [address]  I-stat troponin, ED     Status: None   Collection Time: 08/18/18  4:25 AM  Result Value Ref Range   Troponin i, poc 0.00 0.00 - 0.08 ng/mL   Comment 3            Comment: Due to the release kinetics of cTnI, a negative result within the first hours of the onset of symptoms does not rule out myocardial infarction with certainty. If myocardial infarction is still suspected, repeat the test at appropriate intervals.    Personally reviewed- CXR with some atelectasis, no pneumonia appreciated  CT- with stones, some haziness around infundibulum, no biliary dilation / lipoma in the region of the right lower rib where "knot" is felt by patient  [place] without any edema around gallbladder  Dg Chest 2 View  Result Date: 08/18/2018 CLINICAL DATA:  Acute onset of nausea, vomiting and diarrhea. Midsternal and epigastric abdominal pain. Right-sided rib knot. EXAM: CHEST - 2 VIEW  COMPARISON:  None. FINDINGS: The lungs are well-aerated. Mild left basilar airspace opacity may  reflect atelectasis or mild pneumonia. There is no evidence of pleural effusion or pneumothorax. The heart is normal in size; the mediastinal contour is within normal limits. No acute osseous abnormalities are seen. IMPRESSION: Mild left basilar airspace opacity may reflect atelectasis or mild pneumonia. Electronically Signed   By: Roanna Raider M.D.   On: 08/18/2018 04:49   Dg Ribs Unilateral Right  Result Date: 08/18/2018 CLINICAL DATA:  Acute onset of right-sided rib knot, with tenderness. Midsternal chest pain. EXAM: RIGHT RIBS - 2 VIEW COMPARISON:  None. FINDINGS: No displaced rib fractures are seen. The visualized portions of the lungs are grossly clear. The right glenohumeral joint is unremarkable in appearance. IMPRESSION: No displaced rib fracture seen. Electronically Signed   By: Roanna Raider M.D.   On: 08/18/2018 04:50   Ct Abdomen Pelvis W Contrast  Result Date: 08/18/2018 CLINICAL DATA:  58 year old male with history of nausea, vomiting and diarrhea for the past several days. Midsternal and epigastric pain. EXAM: CT ABDOMEN AND PELVIS WITH CONTRAST TECHNIQUE: Multidetector CT imaging of the abdomen and pelvis was performed using the standard protocol following bolus administration of intravenous contrast. CONTRAST:  ISOVUE-300 IOPAMIDOL (ISOVUE-300) INJECTION 61% COMPARISON:  None. FINDINGS: Lower chest: Atherosclerotic calcifications in the right coronary artery. Hepatobiliary: No suspicious cystic or solid hepatic lesions. No intra or extrahepatic biliary ductal dilatation. Calcified gallstones lying dependently in the gallbladder. Gallbladder is not distended. No gallbladder wall thickening. However, there is some mild haziness in the fat adjacent to the neck of the gallbladder. Pancreas: No pancreatic mass. No pancreatic ductal dilatation. No pancreatic or peripancreatic fluid or  inflammatory changes. Spleen: Unremarkable. Adrenals/Urinary Tract: Subcentimeter low-attenuation lesions in the right kidney, too small to characterize, but statistically likely to represent tiny cysts. Left kidney and bilateral adrenal glands are normal in appearance. No hydroureteronephrosis. Urinary bladder is normal in appearance. Stomach/Bowel: Normal appearance of the stomach. No pathologic dilatation of small bowel or colon. Normal appendix. Vascular/Lymphatic: Aortic atherosclerosis, without evidence of aneurysm or dissection in the abdominal or pelvic vasculature. No lymphadenopathy noted in the abdomen or pelvis. Reproductive: Prostate gland and seminal vesicles are unremarkable in appearance. Other: No significant volume of ascites.  No pneumoperitoneum. Musculoskeletal: There are no aggressive appearing lytic or blastic lesions noted in the visualized portions of the skeleton. IMPRESSION: 1. Cholelithiasis. There are very subtle inflammatory changes adjacent to the neck of the gallbladder. The possibility of very early acute cholecystitis is not excluded, although the findings are not definitive on today's examination. If there is clinical concern for acute cholecystitis, further evaluation with nuclear medicine hepatobiliary scan could be considered. 2. No other acute findings are noted in the abdomen or pelvis. 3. Aortic atherosclerosis, in addition to at least right coronary artery disease. Please note that although the presence of coronary artery calcium documents the presence of coronary artery disease, the severity of this disease and any potential stenosis cannot be assessed on this non-gated CT examination. Assessment for potential risk factor modification, dietary therapy or pharmacologic therapy may be warranted, if clinically indicated. 4. Normal appendix. Electronically Signed   By: Trudie Reed M.D.   On: 08/18/2018 09:34   US Abdomen Limited Ruq  Result Date: 08/18/2018 CLINICAL  DATA:  Right upper quadrant pain. EXAM: ULTRASOUND ABDOMEN LIMITED RIGHT UPPER QUADRANT COMPARISON:  No prior. FINDINGS: Gallbladder: No gallstones or wall thickening visualized. No sonographic Murphy sign noted by sonographer. Common bile duct: Diameter: 4.3 mm Liver: Increased echogenicity consistent fatty infiltration or hepatocellular disease.  Portal vein is patent on color Doppler imaging with normal direction of blood flow towards the liver. IMPRESSION: 1. Increased hepatic echogenicity consistent fatty infiltration or hepatocellular disease. 2.  No gallstones or biliary distention. Electronically Signed   By: Maisie Fushomas  Register   On: 08/18/2018 08:15    Assessment & Plan:  Chad Underwood is a 58 y.o. male with what is likely acute cholecystitis based on his clinical course and tenderness on exam. He is very tender and has findings of stones on the CT with some haziness.  We have discussed that this is likely his gallbladder but that it could also be related to a viral gastroenteritis, gastritis / ulcer but that he has many things that point to the gallbladder including the leukocytosis, the tenderness and the CT findings.   We discussed the option of HIDA scan, the need for holding pain medication for the HIDA scan, the use of IV antibiotics and waiting for surgery on Monday if the HIDA was positive.  At this time the patient wants to proceed with surgery.   He has some atelectasis on CXR but no symptoms of PNA otherwise. He has been splinting due to the pain and this is likely the cause.   PLAN: I counseled the patient about the indication, risks and benefits of laparoscopic cholecystectomy.  He understands there is a very small chance for bleeding, infection, injury to normal structures (including common bile duct), conversion to open surgery, persistent symptoms, evolution of postcholecystectomy diarrhea, need for secondary interventions, anesthesia reaction, cardiopulmonary issues and other risks  not specifically detailed here. I described the expected recovery, the plan for follow-up and the restrictions during the recovery phase.  All questions were answered.  All questions were answered to the satisfaction of the patient and family.   Chad Underwood 08/18/2018, 12:14 PM

## 2018-08-18 NOTE — Transfer of Care (Signed)
Immediate Anesthesia Transfer of Care Note  Patient: Chad Underwood  Procedure(s) Performed: LAPAROSCOPIC CHOLECYSTECTOMY (N/A )  Patient Location: PACU  Anesthesia Type:General  Level of Consciousness: awake, alert  and oriented  Airway & Oxygen Therapy: Patient Spontanous Breathing and Patient connected to nasal cannula oxygen  Post-op Assessment: Report given to RN and Post -op Vital signs reviewed and stable  Post vital signs: Reviewed and stable  Last Vitals:  Vitals Value Taken Time  BP    Temp    Pulse 64 08/18/2018  2:18 PM  Resp    SpO2 88 % 08/18/2018  2:18 PM  Vitals shown include unvalidated device data.  Last Pain:  Vitals:   08/18/18 1224  TempSrc: Oral  PainSc: 3          Complications: No apparent anesthesia complications

## 2018-08-18 NOTE — Progress Notes (Signed)
[facility]  Will place patient in observation bed. If tolerates diet, has good pain control, and wants to go home later today that is fine. RN will just need to let me know.  Algis Greenhouse, MD [facility] [address] [place], [place] [address] [phone] (office)

## 2018-08-21 ENCOUNTER — Encounter (HOSPITAL_COMMUNITY): Payer: Self-pay | Admitting: General Surgery

## 2018-08-29 ENCOUNTER — Encounter: Payer: Self-pay | Admitting: General Surgery

## 2018-08-29 ENCOUNTER — Ambulatory Visit (INDEPENDENT_AMBULATORY_CARE_PROVIDER_SITE_OTHER): Payer: Self-pay | Admitting: General Surgery

## 2018-08-29 VITALS — BP 144/81 | HR 82 | Temp 98.0°F | Resp 18 | Wt 216.8 lb

## 2018-08-29 DIAGNOSIS — K810 Acute cholecystitis: Secondary | ICD-10-CM

## 2018-08-29 NOTE — Progress Notes (Signed)
[facility] Note   HPI:  58 y.o. Male presents to clinic for post-op follow-up evaluation after a laparoscopic cholecystectomy. Patient reports he is doing well. Tolerating diet, no fevers. Says he has some right upper abd and right back pain in the late afternoons.  Asking about question of PNA on his Mychart.   Review of Systems:  Pain improving Some back pain at times Normal BMs Chronic cough but nothing new No fever or chills No productive cough No SOB All other review of systems: otherwise negative   Pathology: Diagnosis Gallbladder - ACUTE AND CHRONIC CHOLECYSTITIS AND CHOLELITHIASIS. - ATTACHED HEPATIC PARENCHYMA. - THERE IS NO EVIDENCE OF MALIGNANCY  Vital Signs:  BP (!) 144/81 (BP Location: Left Arm, Patient Position: Sitting, Cuff Size: Large)   Pulse 82   Temp 98 F (36.7 C) (Temporal)   Resp 18   Wt 216 lb 12.8 oz (98.3 kg)   BMI 31.11 kg/m    Physical Exam:  Physical Exam Neck:     Musculoskeletal: Normal range of motion.  Cardiovascular:     Rate and Rhythm: Normal rate.     Pulses: Normal pulses.  Pulmonary:     Effort: Pulmonary effort is normal.  Abdominal:     General: There is no distension.     Palpations: Abdomen is soft.     Tenderness: There is no abdominal tenderness.     Comments: Port sites healing, no erythema or drainage      Assessment:  58 y.o. yo Male s/p laparoscopic cholecystectomy for cholecystitis. Doing well. Discussed that CT results consistent with atelectasis from his pain/ splinting. Do not think he has PNA as he has not had any other signs or symptoms.   Plan:  - PRN Follow up  - Return to work tomorrow   All of the above recommendations were discussed with the patient and patient's family, and all of patient's and family's questions were answered to their expressed satisfaction.  Algis Greenhouse, MD [facility] [address] [place], [place]  [address] [phone] (office)

## 2018-08-29 NOTE — Patient Instructions (Addendum)
Activity and diet as tolerated.    

## 2020-03-10 IMAGING — CT CT ABD-PELV W/ CM
2 of 5 series · 15 of 46 positions shown, 17 images · IV contrast (Isovue)
Comparison: None.

CLINICAL DATA: 57-year-old male with history of nausea, vomiting
and diarrhea for the past several days. Midsternal and epigastric
pain.

EXAM:
CT ABDOMEN AND PELVIS WITH CONTRAST
TECHNIQUE: Multidetector CT imaging of the abdomen and pelvis was performed
using the standard protocol following bolus administration of
intravenous contrast.
CONTRAST:  100mL YCFDL0-222 IOPAMIDOL (YCFDL0-222) INJECTION 61%

[Series 2: axial st · axial · 0.78mm/px · z∈[+884,+1334]mm · 12 of 102 slices shown, 14 images]
[im 6/102  soft-tissue]
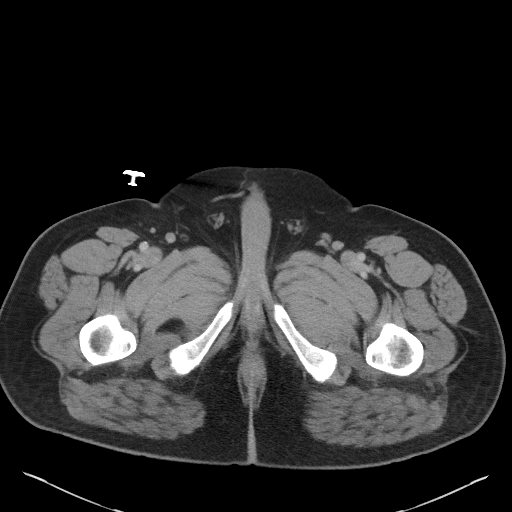
[im 6/102  bone]
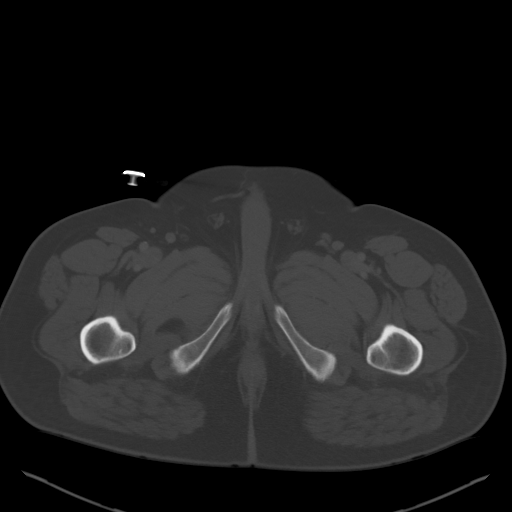
[im 16/102  soft-tissue]
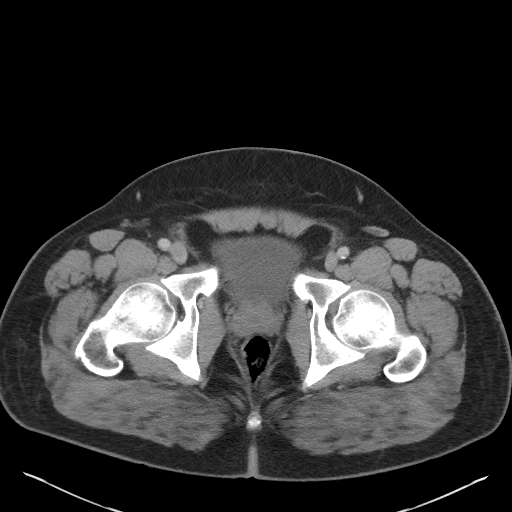
[im 22/102  soft-tissue]
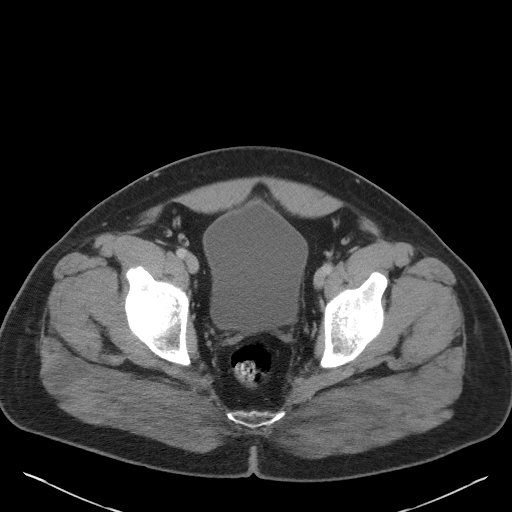
[im 32/102  soft-tissue]
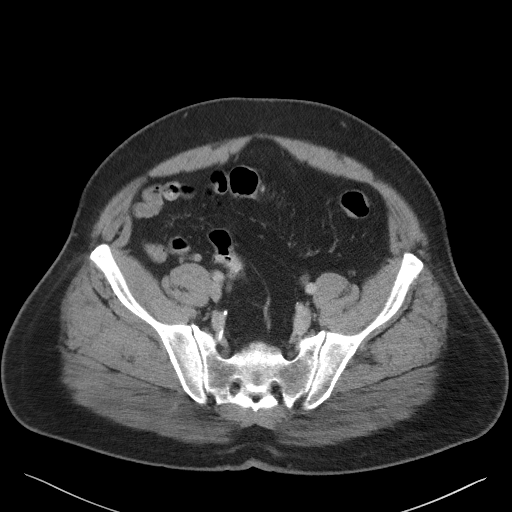
[im 38/102  soft-tissue]
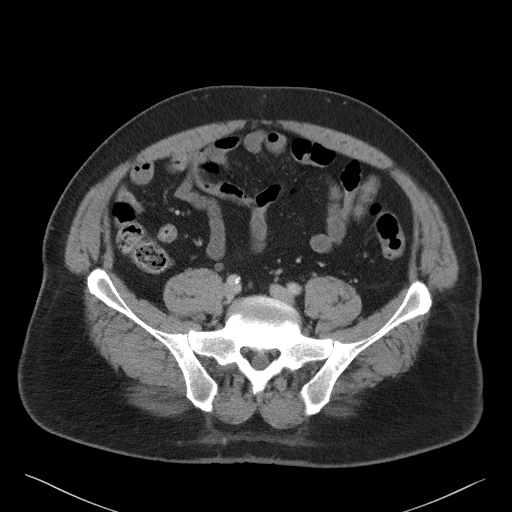
[im 48/102  soft-tissue]
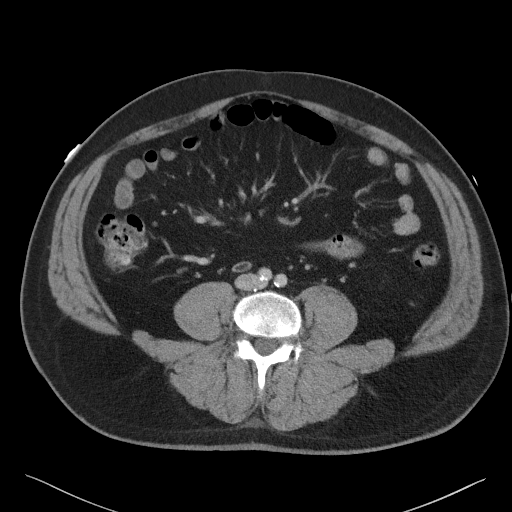
[im 54/102  soft-tissue]
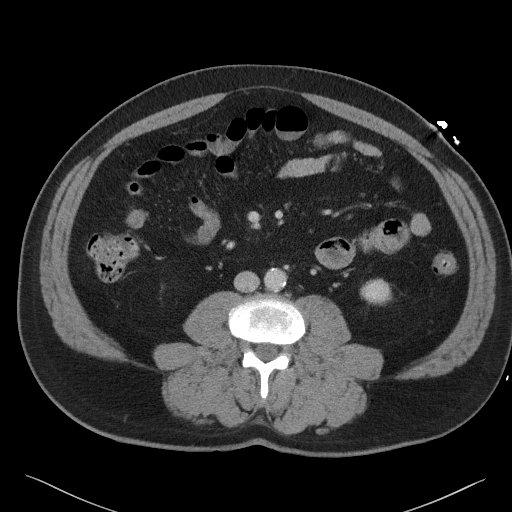
[im 64/102  soft-tissue]
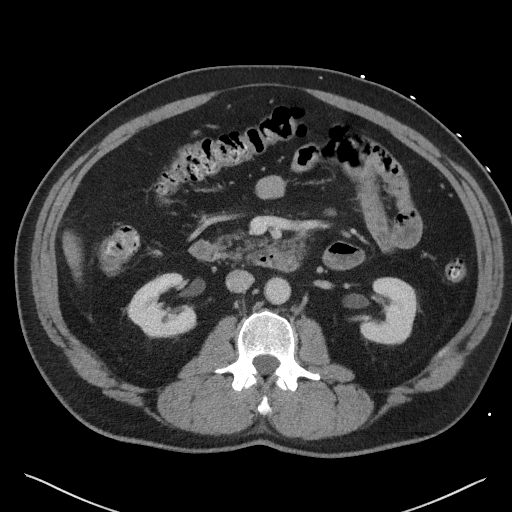
[im 70/102  soft-tissue]
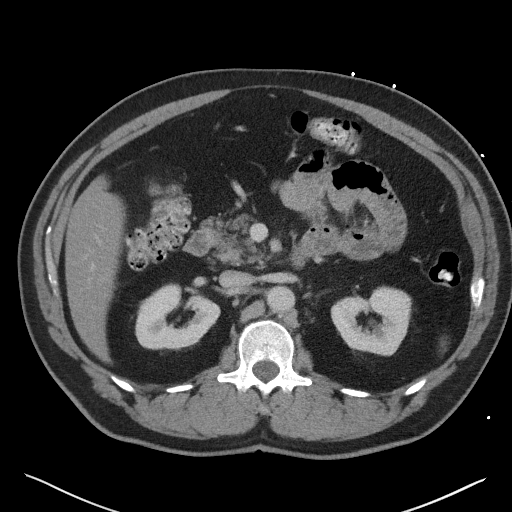
[im 70/102  bone]
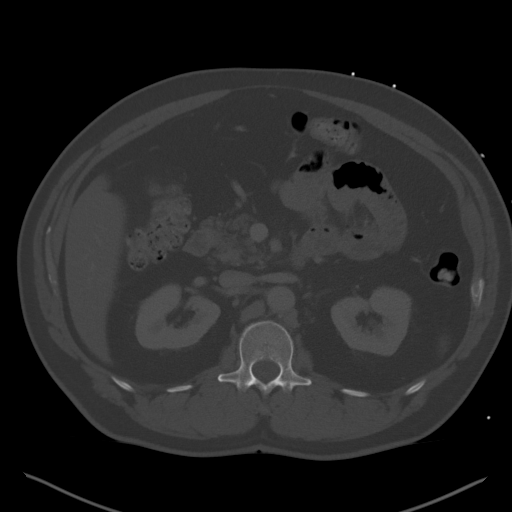
[im 80/102  soft-tissue]
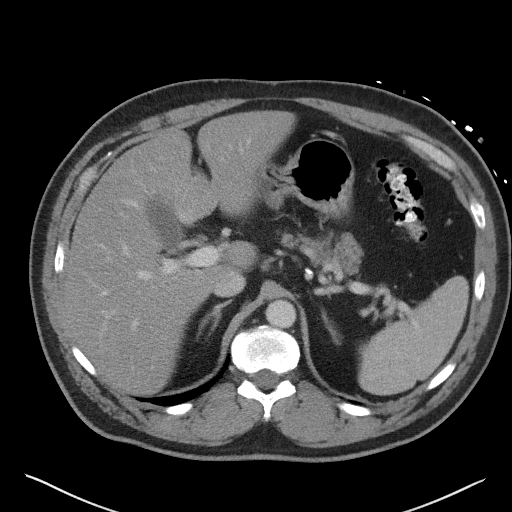
[im 86/102  soft-tissue]
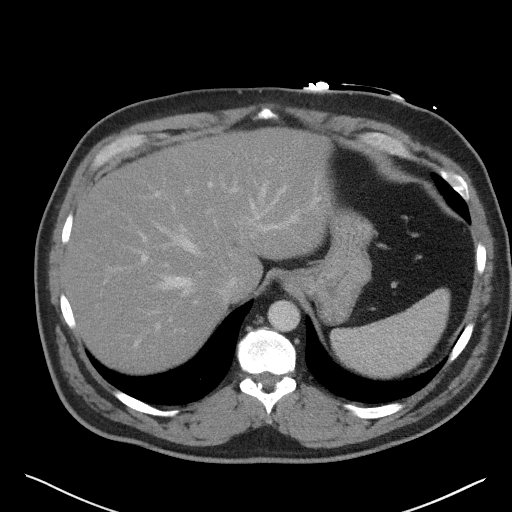
[im 96/102  soft-tissue]
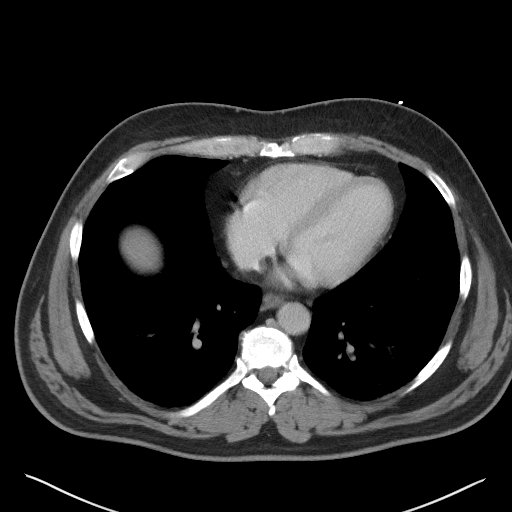

[Series 5: coronal st · coronal · 0.89mm/px · 3 of 103 slices shown]
[im 35/103  soft-tissue]
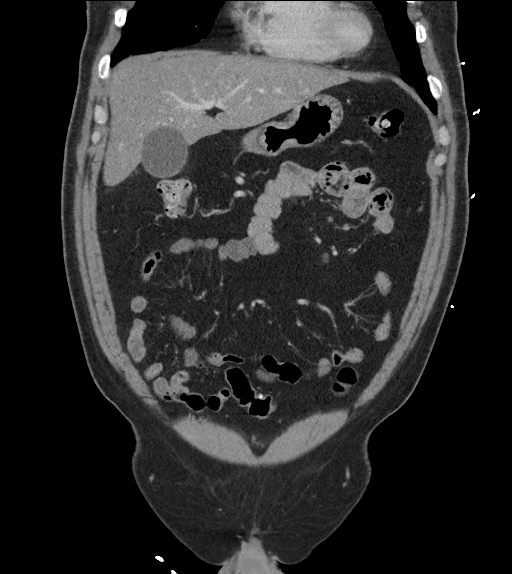
[im 46/103  soft-tissue]
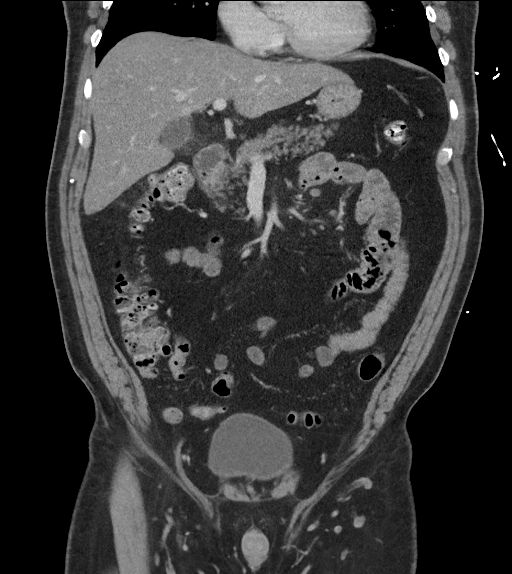
[im 57/103  soft-tissue]
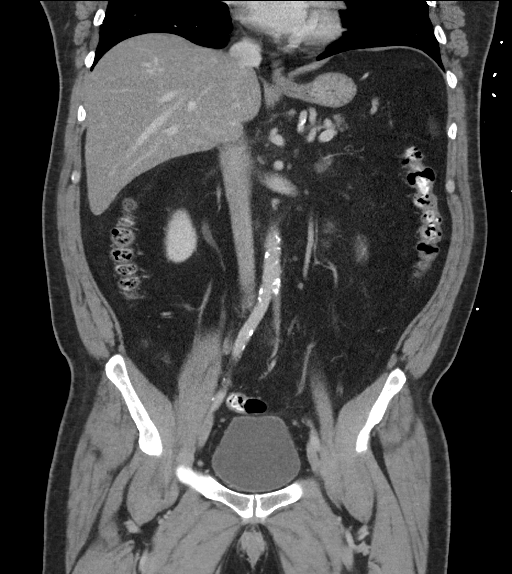

[15 of 46 positions shown; findings below may reference images not displayed]

FINDINGS: Lower chest: Atherosclerotic calcifications in the right coronary
artery.

Hepatobiliary: No suspicious cystic or solid hepatic lesions. No
intra or extrahepatic biliary ductal dilatation. Calcified
gallstones lying dependently in the gallbladder. Gallbladder is not
distended. No gallbladder wall thickening. However, there is some
mild haziness in the fat adjacent to the neck of the gallbladder.

Pancreas: No pancreatic mass. No pancreatic ductal dilatation. No
pancreatic or peripancreatic fluid or inflammatory changes.

Spleen: Unremarkable.

Adrenals/Urinary Tract: Subcentimeter low-attenuation lesions in the
right kidney, too small to characterize, but statistically likely to
represent tiny cysts. Left kidney and bilateral adrenal glands are
normal in appearance. No hydroureteronephrosis. Urinary bladder is
normal in appearance.

Stomach/Bowel: Normal appearance of the stomach. No pathologic
dilatation of small bowel or colon. Normal appendix.

Vascular/Lymphatic: Aortic atherosclerosis, without evidence of
aneurysm or dissection in the abdominal or pelvic vasculature. No
lymphadenopathy noted in the abdomen or pelvis.

Reproductive: Prostate gland and seminal vesicles are unremarkable
in appearance.

Other: No significant volume of ascites.  No pneumoperitoneum.

Musculoskeletal: There are no aggressive appearing lytic or blastic
lesions noted in the visualized portions of the skeleton.
IMPRESSION: 1. Cholelithiasis. There are very subtle inflammatory changes
adjacent to the neck of the gallbladder. The possibility of very
early acute cholecystitis is not excluded, although the findings are
not definitive on today's examination. If there is clinical concern
for acute cholecystitis, further evaluation with nuclear medicine
hepatobiliary scan could be considered.
2. No other acute findings are noted in the abdomen or pelvis.
3. Aortic atherosclerosis, in addition to at least right coronary
artery disease. Please note that although the presence of coronary
artery calcium documents the presence of coronary artery disease,
the severity of this disease and any potential stenosis cannot be
assessed on this non-gated CT examination. Assessment for potential
risk factor modification, dietary therapy or pharmacologic therapy
may be warranted, if clinically indicated.
4. Normal appendix.

## 2020-03-10 IMAGING — US US ABDOMEN LIMITED
1 series · 14 of 25 positions shown · non-contrast
Comparison: No prior.

CLINICAL DATA: Right upper quadrant pain.

EXAM:
ULTRASOUND ABDOMEN LIMITED RIGHT UPPER QUADRANT

[Series 1: us abdomen limited · 14 of 54 slices shown]
[im 1/54]
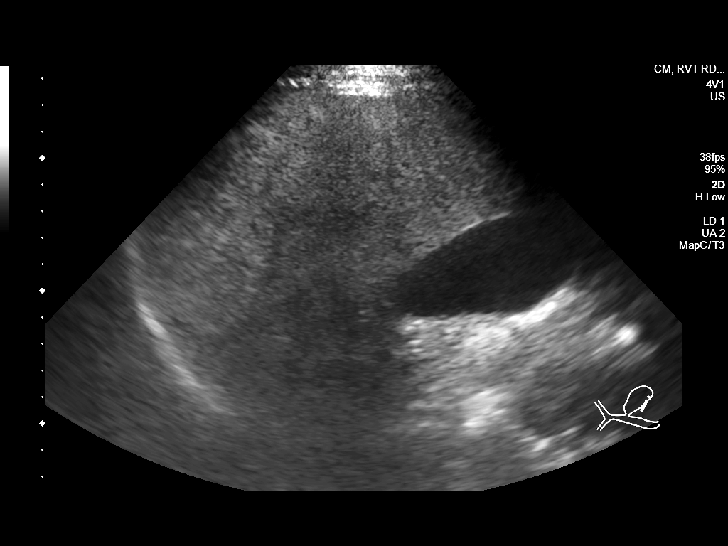
[im 5/54]
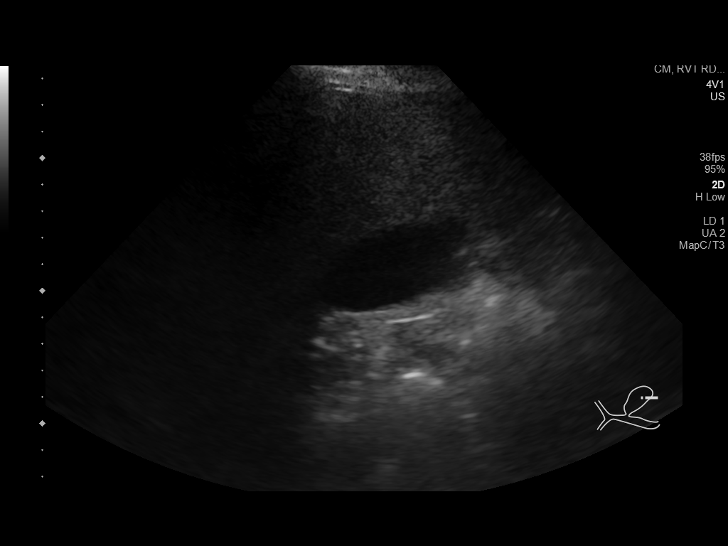
[im 9/54]
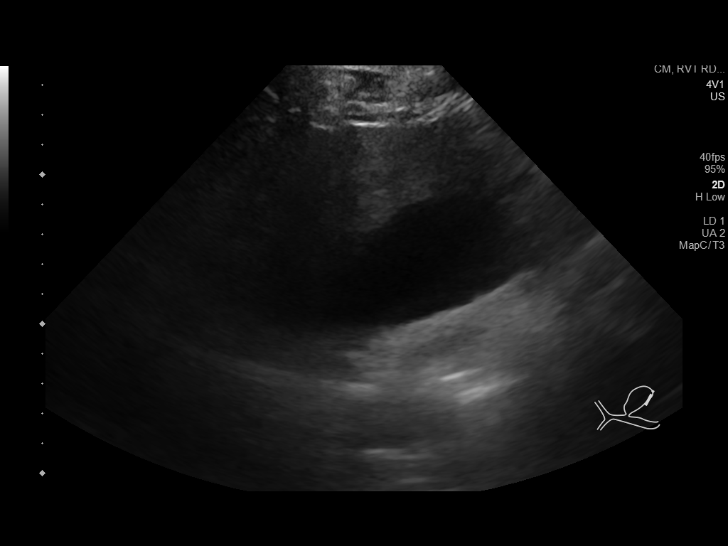
[im 14/54]
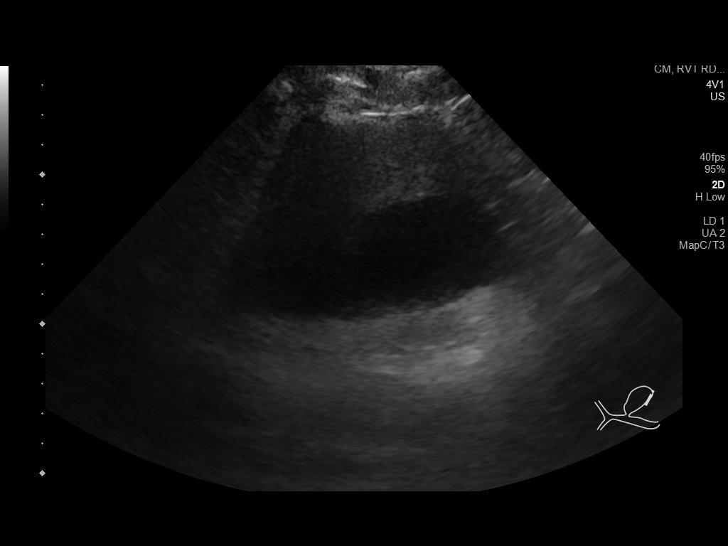
[im 18/54]
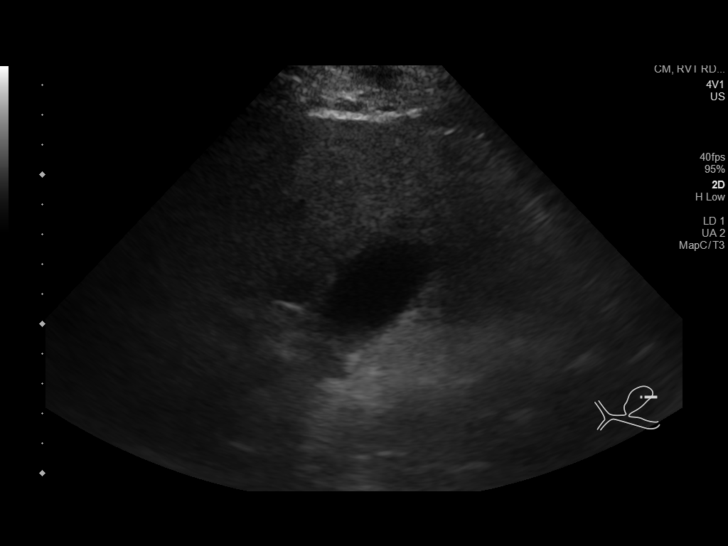
[im 20/54]
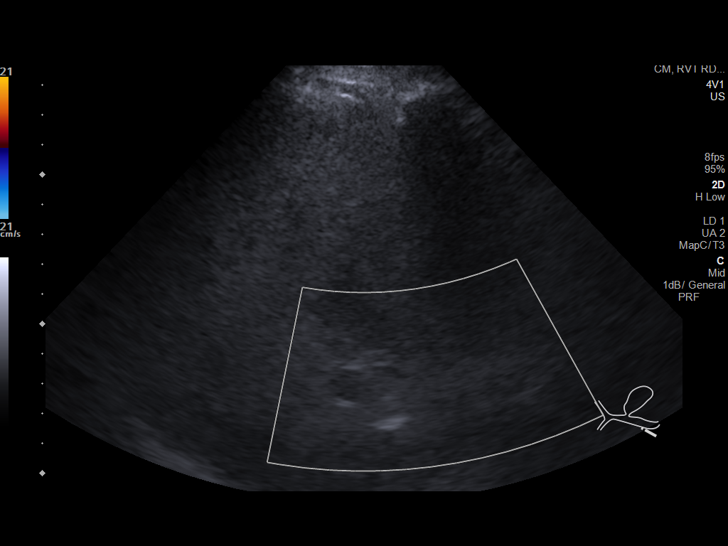
[im 25/54]
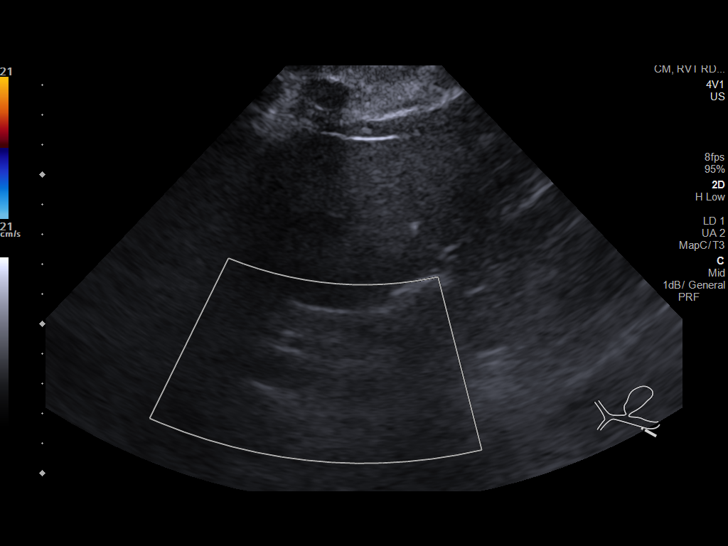
[im 29/54]
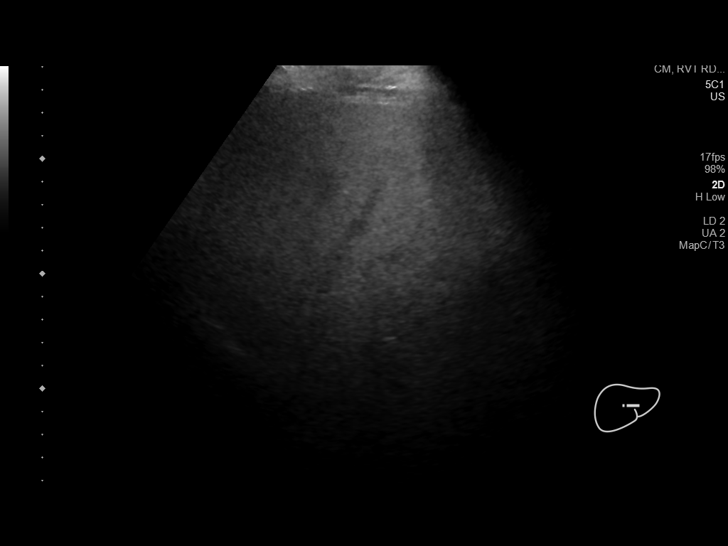
[im 34/54]
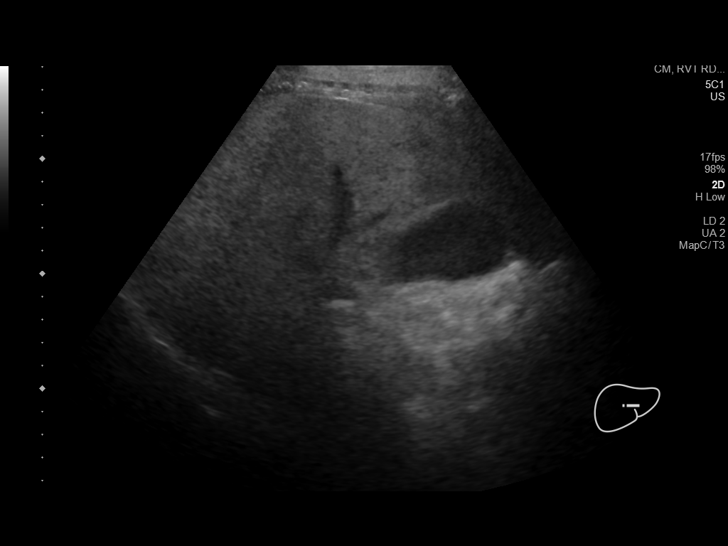
[im 36/54]
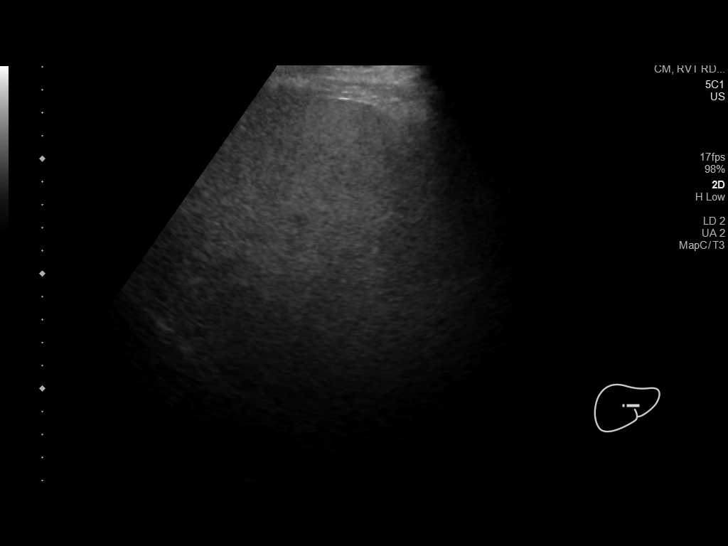
[im 40/54]
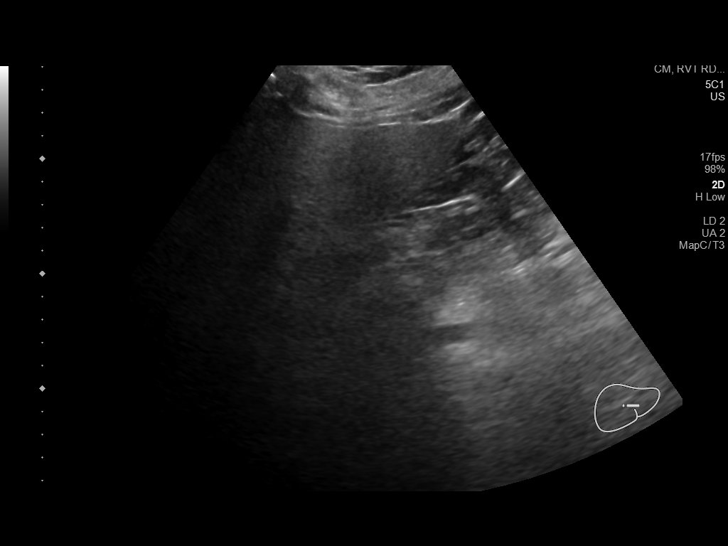
[im 45/54]
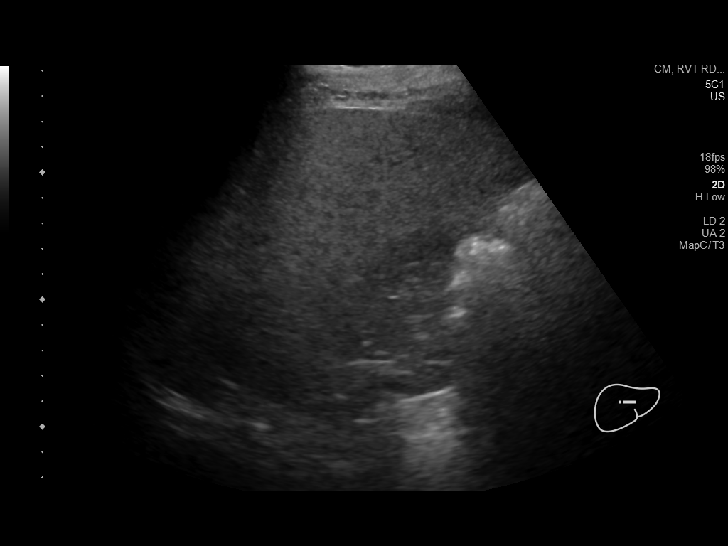
[im 49/54]
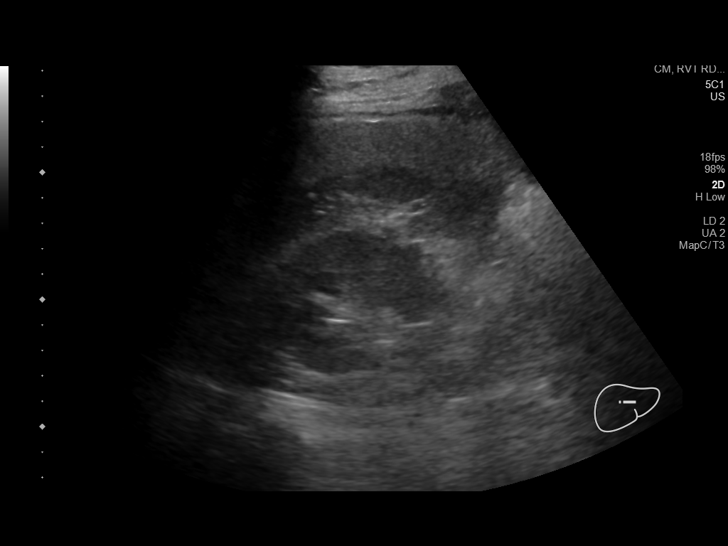
[im 54/54]
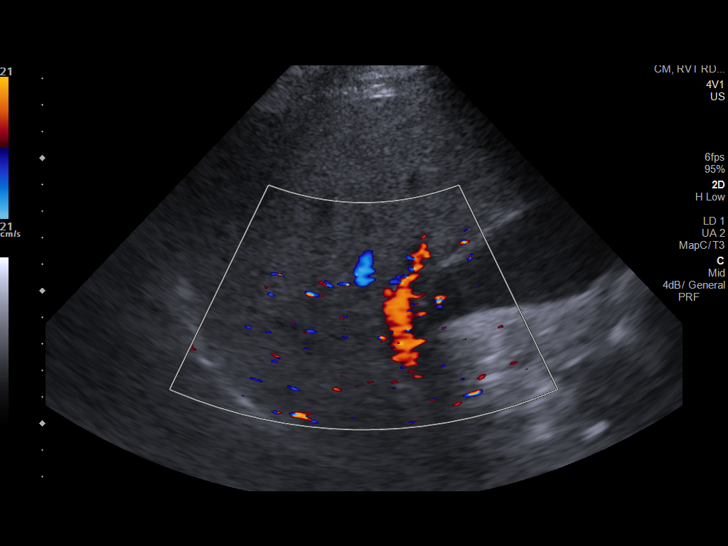

[14 of 25 positions shown; findings below may reference images not displayed]

FINDINGS: Gallbladder:

No gallstones or wall thickening visualized. No sonographic Murphy
sign noted by sonographer.

Common bile duct:

Diameter: 4.3 mm

Liver:

Increased echogenicity consistent fatty infiltration or
hepatocellular disease. Portal vein is patent on color Doppler
imaging with normal direction of blood flow towards the liver.
IMPRESSION: 1. Increased hepatic echogenicity consistent fatty infiltration or
hepatocellular disease.

2.  No gallstones or biliary distention.
